# Patient Record
Sex: Female | Born: 1970 | Race: White | Hispanic: No | Marital: Married | Smoking: Former smoker
Health system: Southern US, Community
[De-identification: ages and names within clinical notes are randomized; demographics above are authoritative.]

## PROBLEM LIST (undated history)

## (undated) DIAGNOSIS — Z9889 Other specified postprocedural states: Secondary | ICD-10-CM

## (undated) DIAGNOSIS — R112 Nausea with vomiting, unspecified: Secondary | ICD-10-CM

## (undated) DIAGNOSIS — I10 Essential (primary) hypertension: Secondary | ICD-10-CM

## (undated) DIAGNOSIS — T8859XA Other complications of anesthesia, initial encounter: Secondary | ICD-10-CM

## (undated) DIAGNOSIS — T4145XA Adverse effect of unspecified anesthetic, initial encounter: Secondary | ICD-10-CM

## (undated) HISTORY — PX: CHOLECYSTECTOMY: SHX55

## (undated) HISTORY — PX: BREAST LUMPECTOMY: SHX2

## (undated) HISTORY — PX: BREAST SURGERY: SHX581

## (undated) HISTORY — PX: ABDOMINAL HYSTERECTOMY: SHX81

---

## 1998-01-17 ENCOUNTER — Ambulatory Visit (HOSPITAL_COMMUNITY): Admission: RE | Admit: 1998-01-17 | Discharge: 1998-01-17 | Payer: Self-pay | Admitting: *Deleted

## 1999-05-10 ENCOUNTER — Other Ambulatory Visit: Admission: RE | Admit: 1999-05-10 | Discharge: 1999-05-10 | Payer: Self-pay | Admitting: *Deleted

## 2004-12-11 ENCOUNTER — Encounter: Admission: RE | Admit: 2004-12-11 | Discharge: 2004-12-11 | Payer: Self-pay | Admitting: Obstetrics and Gynecology

## 2005-02-14 ENCOUNTER — Inpatient Hospital Stay (HOSPITAL_COMMUNITY): Admission: AD | Admit: 2005-02-14 | Discharge: 2005-02-14 | Payer: Self-pay | Admitting: Obstetrics and Gynecology

## 2005-02-15 ENCOUNTER — Inpatient Hospital Stay (HOSPITAL_COMMUNITY): Admission: AD | Admit: 2005-02-15 | Discharge: 2005-02-18 | Payer: Self-pay | Admitting: Obstetrics and Gynecology

## 2005-06-28 ENCOUNTER — Ambulatory Visit: Payer: Self-pay | Admitting: Family Medicine

## 2007-10-01 ENCOUNTER — Ambulatory Visit: Payer: Self-pay | Admitting: General Surgery

## 2009-12-29 ENCOUNTER — Ambulatory Visit: Payer: Self-pay | Admitting: Unknown Physician Specialty

## 2012-08-06 ENCOUNTER — Emergency Department: Payer: Self-pay | Admitting: Emergency Medicine

## 2012-09-30 HISTORY — PX: ANKLE SURGERY: SHX546

## 2013-07-06 ENCOUNTER — Ambulatory Visit: Payer: Self-pay | Admitting: Otolaryngology

## 2013-09-30 HISTORY — PX: ANKLE SURGERY: SHX546

## 2014-01-10 ENCOUNTER — Ambulatory Visit: Payer: Self-pay | Admitting: Orthopedic Surgery

## 2014-02-02 ENCOUNTER — Ambulatory Visit: Payer: Self-pay | Admitting: Internal Medicine

## 2014-09-24 ENCOUNTER — Emergency Department: Payer: Self-pay | Admitting: Emergency Medicine

## 2014-09-24 LAB — CBC
HCT: 38.9 % (ref 35.0–47.0)
HGB: 13.2 g/dL (ref 12.0–16.0)
MCH: 30.1 pg (ref 26.0–34.0)
MCHC: 34 g/dL (ref 32.0–36.0)
MCV: 89 fL (ref 80–100)
PLATELETS: 225 10*3/uL (ref 150–440)
RBC: 4.39 10*6/uL (ref 3.80–5.20)
RDW: 12.9 % (ref 11.5–14.5)
WBC: 8.6 10*3/uL (ref 3.6–11.0)

## 2014-09-24 LAB — BASIC METABOLIC PANEL
ANION GAP: 10 (ref 7–16)
BUN: 11 mg/dL (ref 7–18)
CALCIUM: 8.5 mg/dL (ref 8.5–10.1)
CO2: 25 mmol/L (ref 21–32)
CREATININE: 1.08 mg/dL (ref 0.60–1.30)
Chloride: 105 mmol/L (ref 98–107)
EGFR (Non-African Amer.): 59 — ABNORMAL LOW
Glucose: 110 mg/dL — ABNORMAL HIGH (ref 65–99)
Osmolality: 279 (ref 275–301)
Potassium: 3.5 mmol/L (ref 3.5–5.1)
Sodium: 140 mmol/L (ref 136–145)

## 2014-09-24 LAB — TROPONIN I

## 2014-09-25 LAB — D-DIMER(ARMC): D-Dimer: 778 ng/ml

## 2014-09-30 LAB — CULTURE, BLOOD (SINGLE)

## 2014-10-26 ENCOUNTER — Ambulatory Visit: Payer: Self-pay | Admitting: Internal Medicine

## 2017-03-25 ENCOUNTER — Other Ambulatory Visit: Payer: Self-pay | Admitting: Podiatry

## 2017-03-25 DIAGNOSIS — M25572 Pain in left ankle and joints of left foot: Secondary | ICD-10-CM

## 2017-03-31 ENCOUNTER — Ambulatory Visit: Payer: 59

## 2017-08-26 ENCOUNTER — Other Ambulatory Visit: Payer: Self-pay | Admitting: Podiatry

## 2017-08-27 ENCOUNTER — Ambulatory Visit: Payer: 59 | Admitting: Anesthesiology

## 2017-08-27 ENCOUNTER — Ambulatory Visit
Admission: RE | Admit: 2017-08-27 | Discharge: 2017-08-27 | Disposition: A | Discharge status: Home / Self Care | Payer: 59 | Source: Ambulatory Visit | Attending: Podiatry | Admitting: Podiatry

## 2017-08-27 ENCOUNTER — Encounter
Admission: RE | Disposition: A | Discharge status: Home / Self Care | Payer: Self-pay | Source: Ambulatory Visit | Attending: Podiatry

## 2017-08-27 DIAGNOSIS — S86312A Strain of muscle(s) and tendon(s) of peroneal muscle group at lower leg level, left leg, initial encounter: Secondary | ICD-10-CM | POA: Insufficient documentation

## 2017-08-27 DIAGNOSIS — E785 Hyperlipidemia, unspecified: Secondary | ICD-10-CM | POA: Insufficient documentation

## 2017-08-27 DIAGNOSIS — Z87891 Personal history of nicotine dependence: Secondary | ICD-10-CM | POA: Diagnosis not present

## 2017-08-27 DIAGNOSIS — E119 Type 2 diabetes mellitus without complications: Secondary | ICD-10-CM | POA: Insufficient documentation

## 2017-08-27 DIAGNOSIS — I10 Essential (primary) hypertension: Secondary | ICD-10-CM | POA: Insufficient documentation

## 2017-08-27 DIAGNOSIS — M65872 Other synovitis and tenosynovitis, left ankle and foot: Secondary | ICD-10-CM | POA: Diagnosis not present

## 2017-08-27 DIAGNOSIS — F418 Other specified anxiety disorders: Secondary | ICD-10-CM | POA: Insufficient documentation

## 2017-08-27 DIAGNOSIS — Z79899 Other long term (current) drug therapy: Secondary | ICD-10-CM | POA: Insufficient documentation

## 2017-08-27 DIAGNOSIS — X58XXXA Exposure to other specified factors, initial encounter: Secondary | ICD-10-CM | POA: Diagnosis not present

## 2017-08-27 HISTORY — DX: Other specified postprocedural states: Z98.890

## 2017-08-27 HISTORY — PX: TENDON REPAIR: SHX5111

## 2017-08-27 HISTORY — DX: Essential (primary) hypertension: I10

## 2017-08-27 HISTORY — DX: Nausea with vomiting, unspecified: R11.2

## 2017-08-27 HISTORY — DX: Adverse effect of unspecified anesthetic, initial encounter: T41.45XA

## 2017-08-27 HISTORY — DX: Other complications of anesthesia, initial encounter: T88.59XA

## 2017-08-27 SURGERY — TENDON REPAIR
Anesthesia: Regional | Laterality: Left | Wound class: Clean

## 2017-08-27 MED ORDER — CEFAZOLIN SODIUM-DEXTROSE 2-4 GM/100ML-% IV SOLN
2.0000 g | INTRAVENOUS | Status: AC
  Administered 2017-08-27: 2 g via INTRAVENOUS

## 2017-08-27 MED ORDER — GLYCOPYRROLATE 0.2 MG/ML IJ SOLN
INTRAMUSCULAR | Status: DC | PRN
  Administered 2017-08-27: 0.1 mg via INTRAVENOUS

## 2017-08-27 MED ORDER — SCOPOLAMINE 1 MG/3DAYS TD PT72
1.0000 | MEDICATED_PATCH | TRANSDERMAL | Status: DC
  Administered 2017-08-27: 1.5 mg via TRANSDERMAL

## 2017-08-27 MED ORDER — LIDOCAINE HCL (CARDIAC) 20 MG/ML IV SOLN
INTRAVENOUS | Status: DC | PRN
  Administered 2017-08-27: 40 mg via INTRATRACHEAL

## 2017-08-27 MED ORDER — BUPIVACAINE HCL (PF) 0.25 % IJ SOLN
INTRAMUSCULAR | Status: DC | PRN
  Administered 2017-08-27: 10 mL

## 2017-08-27 MED ORDER — PROPOFOL 10 MG/ML IV BOLUS
INTRAVENOUS | Status: DC | PRN
  Administered 2017-08-27: 140 mg via INTRAVENOUS

## 2017-08-27 MED ORDER — DEXAMETHASONE SODIUM PHOSPHATE 4 MG/ML IJ SOLN
INTRAMUSCULAR | Status: DC | PRN
  Administered 2017-08-27: 4 mg via INTRAVENOUS

## 2017-08-27 MED ORDER — LACTATED RINGERS IV SOLN
10.0000 mL/h | INTRAVENOUS | Status: DC
  Administered 2017-08-27: 10 mL/h via INTRAVENOUS

## 2017-08-27 MED ORDER — FENTANYL CITRATE (PF) 100 MCG/2ML IJ SOLN
INTRAMUSCULAR | Status: DC | PRN
  Administered 2017-08-27: 100 ug via INTRAVENOUS

## 2017-08-27 MED ORDER — POVIDONE-IODINE 7.5 % EX SOLN
Freq: Once | CUTANEOUS | Status: AC
  Administered 2017-08-27: 11:00:00 via TOPICAL

## 2017-08-27 MED ORDER — ROPIVACAINE HCL 5 MG/ML IJ SOLN
INTRAMUSCULAR | Status: DC | PRN
  Administered 2017-08-27: 30 mL via EPIDURAL

## 2017-08-27 MED ORDER — OXYCODONE-ACETAMINOPHEN 5-325 MG PO TABS: 1.0000 | ORAL_TABLET | ORAL | 0 refills | Status: DC | PRN

## 2017-08-27 MED ORDER — MIDAZOLAM HCL 5 MG/5ML IJ SOLN
INTRAMUSCULAR | Status: DC | PRN
  Administered 2017-08-27: 2 mg via INTRAVENOUS

## 2017-08-27 MED ORDER — ONDANSETRON HCL 4 MG/2ML IJ SOLN
INTRAMUSCULAR | Status: DC | PRN
  Administered 2017-08-27: 4 mg via INTRAVENOUS

## 2017-08-27 SURGICAL SUPPLY — 43 items
APL SKNCLS STERI-STRIP NONHPOA (GAUZE/BANDAGES/DRESSINGS) ×1
BANDAGE ELASTIC 4 LF NS (GAUZE/BANDAGES/DRESSINGS) ×2 IMPLANT
BENZOIN TINCTURE PRP APPL 2/3 (GAUZE/BANDAGES/DRESSINGS) ×1 IMPLANT
BLADE SURG 15 STRL LF DISP TIS (BLADE) IMPLANT
BLADE SURG 15 STRL SS (BLADE) ×2
BNDG CMPR 75X41 PLY HI ABS (GAUZE/BANDAGES/DRESSINGS)
BNDG CMPR MED 5X4 ELC HKLP NS (GAUZE/BANDAGES/DRESSINGS) ×1
BNDG ESMARK 4X12 TAN STRL LF (GAUZE/BANDAGES/DRESSINGS) ×2 IMPLANT
BNDG GAUZE 4.5X4.1 6PLY STRL (MISCELLANEOUS) ×2 IMPLANT
BNDG STRETCH 4X75 STRL LF (GAUZE/BANDAGES/DRESSINGS) IMPLANT
CANISTER SUCT 1200ML W/VALVE (MISCELLANEOUS) ×2 IMPLANT
COVER LIGHT HANDLE UNIVERSAL (MISCELLANEOUS) ×4 IMPLANT
CUFF TOURN SGL QUICK 24 (TOURNIQUET CUFF) ×2
CUFF TRNQT CYL 24X4X40X1 (TOURNIQUET CUFF) IMPLANT
DURAPREP 26ML APPLICATOR (WOUND CARE) ×2 IMPLANT
GAUZE PETRO XEROFOAM 1X8 (MISCELLANEOUS) ×2 IMPLANT
GAUZE SPONGE 4X4 12PLY STRL (GAUZE/BANDAGES/DRESSINGS) ×2 IMPLANT
GLOVE BIO SURGEON STRL SZ7.5 (GLOVE) ×2 IMPLANT
GLOVE INDICATOR 8.0 STRL GRN (GLOVE) ×2 IMPLANT
GOWN STRL REUS W/ TWL LRG LVL3 (GOWN DISPOSABLE) ×1 IMPLANT
GOWN STRL REUS W/ TWL XL LVL3 (GOWN DISPOSABLE) ×1 IMPLANT
GOWN STRL REUS W/TWL LRG LVL3 (GOWN DISPOSABLE) ×2
GOWN STRL REUS W/TWL XL LVL3 (GOWN DISPOSABLE) ×2
IV NS 500ML (IV SOLUTION) ×2
IV NS 500ML BAXH (IV SOLUTION) IMPLANT
KIT ROOM TURNOVER OR (KITS) ×2 IMPLANT
NS IRRIG 500ML POUR BTL (IV SOLUTION) ×2 IMPLANT
PACK EXTREMITY ARMC (MISCELLANEOUS) ×2 IMPLANT
PAD GROUND ADULT SPLIT (MISCELLANEOUS) ×2 IMPLANT
PADDING CAST BLEND 4X4 NS (MISCELLANEOUS) ×2 IMPLANT
STOCKINETTE STRL 6IN 960660 (GAUZE/BANDAGES/DRESSINGS) ×2 IMPLANT
STRAP BODY AND KNEE 60X3 (MISCELLANEOUS) ×4 IMPLANT
STRIP CLOSURE SKIN 1/4X4 (GAUZE/BANDAGES/DRESSINGS) ×2 IMPLANT
SUT MNCRL+ 5-0 UNDYED PC-3 (SUTURE) IMPLANT
SUT MONOCRYL 5-0 (SUTURE) ×1
SUT PDS 2-0 27IN (SUTURE) ×1 IMPLANT
SUT VIC AB 2-0 SH 27 (SUTURE) ×2
SUT VIC AB 2-0 SH 27XBRD (SUTURE) IMPLANT
SUT VIC AB 3-0 SH 27 (SUTURE) ×2
SUT VIC AB 3-0 SH 27X BRD (SUTURE) IMPLANT
SUT VIC AB 4-0 SH 27 (SUTURE) ×2
SUT VIC AB 4-0 SH 27XANBCTRL (SUTURE) IMPLANT
WAND TOPAZ MICRO DEBRIDER (MISCELLANEOUS) ×1 IMPLANT

## 2017-08-27 NOTE — H&P (Signed)
HISTORY AND PHYSICAL INTERVAL NOTE:  08/27/2017  12:14 PM  Penny Oliver  has presented today for surgery, with the diagnosis of Peroneal tendon tear.  The various methods of treatment have been discussed with the patient.  No guarantees were given.  After consideration of risks, benefits and other options for treatment, the patient has consented to surgery.  I have reviewed the patients' chart and labs.    Patient Vitals for the past 24 hrs:  BP Temp Temp src Pulse Resp SpO2 Height Weight  08/27/17 1155 - - - 62 16 99 % - -  08/27/17 1150 - - - 75 (!) 22 100 % - -  08/27/17 1145 107/80 - - 62 16 99 % - -  08/27/17 1140 - - - 60 (!) 22 99 % - -  08/27/17 1135 - - - 65 19 99 % - -  08/27/17 1130 103/82 - - 65 14 98 % - -  08/27/17 1125 - - - 68 (!) 23 99 % - -  08/27/17 1120 - - - 69 17 98 % - -  08/27/17 1115 112/76 - - 72 20 98 % - -  08/27/17 1110 107/74 - - 78 14 96 % - -  08/27/17 1109 107/74 - - 73 13 97 % - -  08/27/17 1105 130/82 - - 72 17 100 % - -  08/27/17 1042 126/86 98.1 F (36.7 C) Temporal 80 16 98 % 5\' 6"  (1.676 m) 83.9 kg (185 lb)    A history and physical examination was performed in my office.  The patient was reexamined.  There have been no changes to this history and physical examination.  [name], Davin Archuletta A

## 2017-08-27 NOTE — Brief Op Note (Signed)
08/27/2017  2:38 PM  PATIENT:  Penny Oliver  46 y.o. female  PRE-OPERATIVE DIAGNOSIS:  Peroneal tendon tear  POST-OPERATIVE DIAGNOSIS:  Peroneal tendon tear  PROCEDURE:  Procedure(s) with comments: TENDON REPAIR-Peroneal tendon (Left) - general w/ popliteal  SURGEON:  Surgeon(s) and Role:    Gwyneth Revels* Burtis Imhoff, DPM - Primary  PHYSICIAN ASSISTANT:   ASSISTANTS: none   ANESTHESIA:   local, regional and general  EBL:  5 mL   BLOOD ADMINISTERED:none  DRAINS: none   LOCAL MEDICATIONS USED:  BUPIVICAINE   SPECIMEN:  Source of Specimen:  left peroneal tendon  DISPOSITION OF SPECIMEN:  PATHOLOGY  COUNTS:  YES  TOURNIQUET:  * Missing tourniquet times found for documented tourniquets in log: [id number] *  250mmhg x ~70 minutes  DICTATION: .Dragon Dictation  PLAN OF CARE: Discharge to home after PACU  PATIENT DISPOSITION:  PACU - hemodynamically stable.   Delay start of Pharmacological VTE agent (>24hrs) due to surgical blood loss or risk of bleeding: n/a

## 2017-08-27 NOTE — Anesthesia Preprocedure Evaluation (Signed)
Anesthesia Evaluation  Patient identified by MRN, date of birth, ID band Patient awake    Reviewed: Allergy & Precautions, H&P , NPO status , Patient's Chart, lab work & pertinent test results, reviewed documented beta blocker date and time   History of Anesthesia Complications (+) PONV and history of anesthetic complications  Airway Mallampati: II  TM Distance: >3 FB Neck ROM: full    Dental no notable dental hx.    Pulmonary neg pulmonary ROS, former smoker,    Pulmonary exam normal breath sounds clear to auscultation       Cardiovascular Exercise Tolerance: Good hypertension, negative cardio ROS   Rhythm:regular Rate:Normal     Neuro/Psych negative neurological ROS  negative psych ROS   GI/Hepatic negative GI ROS, Neg liver ROS,   Endo/Other  negative endocrine ROS  Renal/GU negative Renal ROS  negative genitourinary   Musculoskeletal   Abdominal   Peds  Hematology negative hematology ROS (+)   Anesthesia Other Findings   Reproductive/Obstetrics negative OB ROS                             Anesthesia Physical Anesthesia Plan  ASA: II  Anesthesia Plan: General and Regional   Post-op Pain Management: GA combined w/ Regional for post-op pain   Induction:   PONV Risk Score and Plan:   Airway Management Planned:   Additional Equipment:   Intra-op Plan:   Post-operative Plan:   Informed Consent: I have reviewed the patients History and Physical, chart, labs and discussed the procedure including the risks, benefits and alternatives for the proposed anesthesia with the patient or authorized representative who has indicated his/her understanding and acceptance.   Dental Advisory Given  Plan Discussed with: CRNA  Anesthesia Plan Comments:         Anesthesia Quick Evaluation

## 2017-08-27 NOTE — Progress Notes (Signed)
Assisted Dr. Mamie NickSouras with left, ultrasound guided, popliteal block. Side rails up, monitors on throughout procedure. See vital signs in flow sheet. Tolerated Procedure well.

## 2017-08-27 NOTE — Anesthesia Postprocedure Evaluation (Signed)
Anesthesia Post Note  Patient: Despina AriasJulie K Zook  Procedure(s) Performed: TENDON REPAIR-Peroneal tendon (Left )  Patient location during evaluation: PACU Anesthesia Type: Regional Level of consciousness: awake and alert Pain management: pain level controlled Vital Signs Assessment: post-procedure vital signs reviewed and stable Respiratory status: spontaneous breathing, nonlabored ventilation, respiratory function stable and patient connected to nasal cannula oxygen Cardiovascular status: blood pressure returned to baseline and stable Postop Assessment: no apparent nausea or vomiting Anesthetic complications: no    SCOURAS, NICOLE ELAINE

## 2017-08-27 NOTE — Discharge Instructions (Signed)
[facility] °[facility] ° °POST OPERATIVE INSTRUCTIONS FOR DR. TROXLER AND DR. Correy Weidner °[facility] ° ° °1. Take your medication as prescribed.  Pain medication should be taken only as needed. ° °2. Keep the dressing clean, dry and intact. ° °3. Keep your foot elevated above the heart level for the first 48 hours. ° °4. Walking to the bathroom and brief periods of walking are acceptable, unless we have instructed you to be non-weight bearing. ° °5. Always use your crutches.  You are to be non-weight bearing. ° °6. Do not take a shower. Baths are permissible as long as the foot is kept out of the water.  ° °7. Every hour you are awake:  °- Bend your knee 15 times. ° ° °8. Call [facility] ([phone]) if any of the following problems occur: °- You develop a temperature or fever. °- The bandage becomes saturated with blood. °- Medication does not stop your pain. °- Injury of the foot occurs. °- Any symptoms of infection including redness, odor, or red streaks running from wound. ° ° °General Anesthesia, Adult, Care After °These instructions provide you with information about caring for yourself after your procedure. Your health care provider may also give you more specific instructions. Your treatment has been planned according to current medical practices, but problems sometimes occur. Call your health care provider if you have any problems or questions after your procedure. °What can I expect after the procedure? °After the procedure, it is common to have: °· Vomiting. °· A sore throat. °· Mental slowness. ° °It is common to feel: °· Nauseous. °· Cold or shivery. °· Sleepy. °· Tired. °· Sore or achy, even in parts of your body where you did not have surgery. ° °Follow these instructions at home: °For at least 24 hours after the procedure: °· Do not: °? Participate in activities where you could fall or become injured. °? Drive. °? Use heavy  machinery. °? Drink alcohol. °? Take sleeping pills or medicines that cause drowsiness. °? Make important decisions or sign legal documents. °? Take care of children on your own. °· Rest. °Eating and drinking °· If you vomit, drink water, juice, or soup when you can drink without vomiting. °· Drink enough fluid to keep your urine clear or pale yellow. °· Make sure you have little or no nausea before eating solid foods. °· Follow the diet recommended by your health care provider. °General instructions °· Have a responsible adult stay with you until you are awake and alert. °· Return to your normal activities as told by your health care provider. Ask your health care provider what activities are safe for you. °· Take over-the-counter and prescription medicines only as told by your health care provider. °· If you smoke, do not smoke without supervision. °· Keep all follow-up visits as told by your health care provider. This is important. °Contact a health care provider if: °· You continue to have nausea or vomiting at home, and medicines are not helpful. °· You cannot drink fluids or start eating again. °· You cannot urinate after 8-12 hours. °· You develop a skin rash. °· You have fever. °· You have increasing redness at the site of your procedure. °Get help right away if: °· You have difficulty breathing. °· You have chest pain. °· You have unexpected bleeding. °· You feel that you are having a life-threatening or urgent problem. °This information is not intended to replace advice given to   you by your health care provider. Make sure you discuss any questions you have with your health care provider. Document Released: 12/23/2000 Document Revised: 02/19/2016 Document Reviewed: 08/31/2015 Elsevier Interactive Patient Education  [employer].

## 2017-08-27 NOTE — Anesthesia Procedure Notes (Signed)
Procedure Name: LMA Insertion Date/Time: 08/27/2017 1:02 PM Performed by: Jimmy PicketAmyot, Bradyn Vassey, CRNA Pre-anesthesia Checklist: Patient identified, Emergency Drugs available, Suction available, Timeout performed and Patient being monitored Patient Re-evaluated:Patient Re-evaluated prior to induction Oxygen Delivery Method: Circle system utilized Preoxygenation: Pre-oxygenation with 100% oxygen Induction Type: IV induction LMA: LMA inserted LMA Size: 4.0 Number of attempts: 1 Placement Confirmation: positive ETCO2 and breath sounds checked- equal and bilateral Tube secured with: Tape

## 2017-08-27 NOTE — Anesthesia Procedure Notes (Signed)
Anesthesia Regional Block: Popliteal block   Pre-Anesthetic Checklist: ,, timeout performed, Correct Patient, Correct Site, Correct Laterality, Correct Procedure, Correct Position, site marked, Risks and benefits discussed,  Surgical consent,  Pre-op evaluation,  At surgeon's request and post-op pain management  Laterality: Left and Lower  Prep: chloraprep       Needles:  Injection technique: Single-shot  Needle Type: Stimiplex     Needle Length: 2cm  Needle Gauge: 22     Additional Needles:   Procedures:,,,, ultrasound used (permanent image in chart),,,,  Narrative:  Start time: 08/27/2017 11:00 AM End time: 08/27/2017 12:02 PM  Performed by: Personally  Anesthesiologist: Aldine ContesScouras, Juli Odom Elaine, MD

## 2017-08-27 NOTE — Transfer of Care (Signed)
Immediate Anesthesia Transfer of Care Note  Patient: Penny Oliver  Procedure(s) Performed: TENDON REPAIR-Peroneal tendon (Left )  Patient Location: PACU  Anesthesia Type: General, Regional  Level of Consciousness: awake, alert  and patient cooperative  Airway and Oxygen Therapy: Patient Spontanous Breathing and Patient connected to supplemental oxygen  Post-op Assessment: Post-op Vital signs reviewed, Patient's Cardiovascular Status Stable, Respiratory Function Stable, Patent Airway and No signs of Nausea or vomiting  Post-op Vital Signs: Reviewed and stable  Complications: No apparent anesthesia complications

## 2017-08-28 NOTE — Op Note (Signed)
Operative note   Surgeon:Kassandra Meriweather Armed forces logistics/support/administrative officerowler    Assistant: None    Preop diagnosis: 1.  Left peroneal brevis tendon tear 2.  Left peroneal longus tenosynovitis    Postop diagnosis: 1.  Left peroneal brevis split longitudinal tear 2.  Intratendinous tear left peroneal longus tendon    Procedure: 1.  Repair left peroneal brevis tear 2.  Repair left peroneal longus tear    EBL: Minimal    Anesthesia:regional and general    Hemostasis: Thigh tourniquet inflated to 250 mmHg for approximately 70 minutes    Specimen: Split peroneal brevis and longus tendon tear    Complications: None    Operative indications:Penny Oliver is an 46 y.o. that presents today for surgical intervention.  The risks/benefits/alternatives/complications have been discussed and consent has been given.    Procedure:  Patient was brought into the OR and placed on the operating table in thesupine position. After anesthesia was obtained theleft lower extremity was prepped and draped in usual sterile fashion.  Attention was directed to the lateral aspect of the ankle where a longitudinal incision was made along the peroneal tendon beginning approximately 3 cm proximal to the fibula and ending approximately 3 cm distal to the fibula.  Sharp and blunt dissection was carried down to the peroneal tendon sheath.  The tendon sheath was then entered.  At this time there was noted to be tenosynovitis which was removed.  A low-lying peroneal brevis muscle belly was noted and partially resected.  Inspection of the tendons revealed the peroneal longus tendon had a an intra-tendinous bulbous area consistent with intratendinous tear.  The peroneal brevis tendon had a small split thickness tendon tear longitudinally.  The peroneal brevis tendon was debrided and a portion of the tendon resected.  At this time the wound was flushed with copious amounts of irrigation.  The peroneal brevis tendon was then tubularized with a 3-0 Vicryl suture.  The  peroneal longus tendon was then evaluated and at the bulbous area and this was then resected intratendinous leak.  Removal of the thickened fibrotic tissue was performed.  Tubularization with 3-0 Vicryl suture was then performed.  Both tendons were then infiltrated with the Topaz wand proximal and distal.  Final irrigation was then performed.  Closure of the peroneal sheath was performed proximal and distal to the fibula with 3-0 Vicryl.  The peroneal retinaculum was reapproximated with a pants over vest suture with a 2-0 PDS.  Continue closure was then performed with a combination of 3-0 and 4-0 Vicryl and the skin was closed with a with 5-0 Monocryl.  The wound was then infiltrated with 0.25% Marcaine.  A popliteal block had been applied to the leg preoperatively.  Patient was placed in a sterile bulky dressing and placed in an equalizer walker boot with the foot in 90 degree neutral position.    Patient tolerated the procedure and anesthesia well.  Was transported from the OR to the PACU with all vital signs stable and vascular status intact. To be discharged per routine protocol.  Will follow up in approximately 1 week in the outpatient clinic.

## 2017-08-29 ENCOUNTER — Encounter: Payer: Self-pay | Admitting: Podiatry

## 2017-08-29 LAB — SURGICAL PATHOLOGY

## 2017-11-18 ENCOUNTER — Ambulatory Visit
Admission: RE | Admit: 2017-11-18 | Discharge: 2017-11-18 | Disposition: A | Discharge status: Home / Self Care | Payer: 59 | Source: Ambulatory Visit | Attending: Family Medicine | Admitting: Family Medicine

## 2017-11-18 ENCOUNTER — Other Ambulatory Visit: Payer: Self-pay | Admitting: Family Medicine

## 2017-11-18 ENCOUNTER — Other Ambulatory Visit
Admission: RE | Admit: 2017-11-18 | Discharge: 2017-11-18 | Disposition: A | Discharge status: Home / Self Care | Payer: 59 | Source: Ambulatory Visit | Attending: Family Medicine | Admitting: Family Medicine

## 2017-11-18 DIAGNOSIS — R7989 Other specified abnormal findings of blood chemistry: Secondary | ICD-10-CM

## 2017-11-18 DIAGNOSIS — R079 Chest pain, unspecified: Secondary | ICD-10-CM | POA: Diagnosis present

## 2017-11-18 DIAGNOSIS — Z0189 Encounter for other specified special examinations: Secondary | ICD-10-CM | POA: Diagnosis present

## 2017-11-18 DIAGNOSIS — R918 Other nonspecific abnormal finding of lung field: Secondary | ICD-10-CM | POA: Diagnosis not present

## 2017-11-18 LAB — TROPONIN I

## 2017-11-18 LAB — FIBRIN DERIVATIVES D-DIMER (ARMC ONLY): FIBRIN DERIVATIVES D-DIMER (ARMC): 909.06 ng{FEU}/mL — AB (ref 0.00–499.00)

## 2017-11-18 MED ORDER — IOPAMIDOL (ISOVUE-370) INJECTION 76%
75.0000 mL | Freq: Once | INTRAVENOUS | Status: AC | PRN
  Administered 2017-11-18: 75 mL via INTRAVENOUS

## 2017-11-24 ENCOUNTER — Encounter: Payer: Self-pay | Admitting: Pulmonary Disease

## 2017-11-24 ENCOUNTER — Ambulatory Visit (INDEPENDENT_AMBULATORY_CARE_PROVIDER_SITE_OTHER): Payer: 59 | Admitting: Pulmonary Disease

## 2017-11-24 ENCOUNTER — Other Ambulatory Visit
Admission: RE | Admit: 2017-11-24 | Discharge: 2017-11-24 | Disposition: A | Discharge status: Home / Self Care | Payer: 59 | Source: Ambulatory Visit | Attending: Pulmonary Disease | Admitting: Pulmonary Disease

## 2017-11-24 VITALS — BP 148/88 | HR 87 | Ht 66.0 in | Wt 189.0 lb

## 2017-11-24 DIAGNOSIS — R000 Tachycardia, unspecified: Secondary | ICD-10-CM

## 2017-11-24 DIAGNOSIS — J189 Pneumonia, unspecified organism: Secondary | ICD-10-CM | POA: Diagnosis not present

## 2017-11-24 DIAGNOSIS — R002 Palpitations: Secondary | ICD-10-CM

## 2017-11-24 DIAGNOSIS — Z853 Personal history of malignant neoplasm of breast: Secondary | ICD-10-CM | POA: Diagnosis not present

## 2017-11-24 LAB — CBC WITH DIFFERENTIAL/PLATELET
BASOS PCT: 3 %
Basophils Absolute: 0.1 10*3/uL (ref 0–0.1)
Eosinophils Absolute: 0.2 10*3/uL (ref 0–0.7)
Eosinophils Relative: 4 %
HEMATOCRIT: 39.7 % (ref 35.0–47.0)
HEMOGLOBIN: 13.3 g/dL (ref 12.0–16.0)
LYMPHS ABS: 1.6 10*3/uL (ref 1.0–3.6)
LYMPHS PCT: 32 %
MCH: 29.5 pg (ref 26.0–34.0)
MCHC: 33.5 g/dL (ref 32.0–36.0)
MCV: 88.2 fL (ref 80.0–100.0)
MONO ABS: 0.4 10*3/uL (ref 0.2–0.9)
MONOS PCT: 9 %
NEUTROS ABS: 2.6 10*3/uL (ref 1.4–6.5)
Neutrophils Relative %: 52 %
Platelets: 281 10*3/uL (ref 150–440)
RBC: 4.5 MIL/uL (ref 3.80–5.20)
RDW: 13.1 % (ref 11.5–14.5)
WBC: 5 10*3/uL (ref 3.6–11.0)

## 2017-11-24 NOTE — Patient Instructions (Signed)
And evaluation of pneumonitis (inflammation in the lungs) several blood tests have been ordered: ESR, ANA, RF, BNP. I have also ordered full lung function tests (PFTs). Follow-up in 3-4 weeks to review the results of the above tests.  If any important results come back in the interim, we will contact you to discuss

## 2017-11-24 NOTE — Progress Notes (Signed)
PULMONARY CONSULT NOTE  Requesting MD/Service: Kerrin Mo, MD Date of initial consultation: 11/24/17 Reason for consultation: Pneumonitis  PT PROFILE: 47 y.o. female former smoker (quit 1998) patient of Dr. Caryl Comes, seen by Dr. Netty Starring 11/18/17 with complaints of episodic tachycardia, chest pain.  Underwent CTA chest revealing no pulmonary embolism but mild patchy groundglass opacities.  Referred for further evaluation.  DATA: 02/04/14 echocardiogram: Normal 09/25/14 CTA chest (report only): No evidence of pulmonary embolus. Patchy right lower lobe pneumonia noted, likely explaining the patient's symptoms 10/26/14 LDCT chest: No acute infiltrate or pulmonary edema. Previous right lower lobe infiltrate has resolved 11/18/17 CTA chest: No pulmonary embolism.  Mild diffuse groundglass opacities consistent with pneumonitis  HPI:  As above.  Her current history dates back to November 2018 when she underwent surgery for repair of left peroneal tendon.  Since that time she has been "sick" with various problems including sinus symptoms, gastroenteritis, sore throat, nasal congestion.  The symptoms have been off and on.  More recently, she has developed episodic tachycardia and palpitations.  She saw her primary care physician with this complaint.  She also noted chest tightness which was non-exertional.  In evaluation, a CT angio of the chest was ordered.  There was no evidence of pulmonary embolism but it did reveal mild diffuse groundglass opacities in a mosaic pattern.  Therefore she is referred for further evaluation.  Other evaluation has included a 24-hour Holter monitor which, per her report, was "okay".  Her most recent episode of tachycardia was this morning.  Again, these episodes are nonexertional.  She exercises on a regular basis, presently doing yoga.  She denies any respiratory complaints and denies chest pain while exercising.  She does have occasional nonproductive cough.  She denies fever  and pleuritic chest pain.  She has had no hemoptysis.  She denies orthopnea, paroxysmal nocturnal dyspnea, lower extremity edema and calf tenderness.  She reports that 3 years ago, she and her husband had to move out of their home due to "black mold".  She was treated for this with alternative therapies by a physician in [place].  She has fully recovered from her symptoms at that time.  She also notes pneumonia on 2 occasions in the past 5 years.  An echocardiogram was performed in 2015.  This was performed for symptoms similar to her current symptoms.  That echocardiogram was entirely normal.  Past Medical History:  Diagnosis Date  . Complication of anesthesia    PONV  . Hypertension   . PONV (postoperative nausea and vomiting)   Breast cancer  Past Surgical History:  Procedure Laterality Date  . ABDOMINAL HYSTERECTOMY    . ANKLE SURGERY Left 2015  . ANKLE SURGERY Right 2014  . BREAST LUMPECTOMY Left   . BREAST SURGERY Left    Breast Reconstruction  . CHOLECYSTECTOMY    . TENDON REPAIR Left 08/27/2017   Procedure: TENDON REPAIR-Peroneal tendon;  Surgeon: Samara Deist, DPM;  Location: [place];  Service: Podiatry;  Laterality: Left;  general w/ popliteal    MEDICATIONS: I have reviewed all medications and confirmed regimen as documented  Social History   Socioeconomic History  . Marital status: Married    Spouse name: Not on file  . Number of children: Not on file  . Years of education: Not on file  . Highest education level: Not on file  Social Needs  . Financial resource strain: Not on file  . Food insecurity - worry: Not on file  . Food  insecurity - inability: Not on file  . Transportation needs - medical: Not on file  . Transportation needs - non-medical: Not on file  Occupational History  . Not on file  Tobacco Use  . Smoking status: Former Smoker    Last attempt to quit: 08/18/1997    Years since quitting: 20.2  . Smokeless tobacco: Never Used   Substance and Sexual Activity  . Alcohol use: Not on file  . Drug use: Not on file  . Sexual activity: Not on file  Other Topics Concern  . Not on file  Social History Narrative  . Not on file    History reviewed. No pertinent family history.  ROS: No fever, myalgias/arthralgias, unexplained weight loss or weight gain No new focal weakness or sensory deficits No otalgia, hearing loss, visual changes, nasal and sinus symptoms, mouth and throat problems No neck pain or adenopathy No abdominal pain, N/V/D, diarrhea, change in bowel pattern No dysuria, change in urinary pattern   Vitals:   11/24/17 1019 11/24/17 1023  BP:  (!) 148/88  Pulse:  87  SpO2:  98%  Weight: 85.7 kg (189 lb)   Height: '5\' 6"'  (1.676 m)      EXAM:  Gen: WDWN, No overt respiratory distress HEENT: NCAT, sclera white, oropharynx normal Neck: Supple without LAN, thyromegaly, JVD Lungs: Percussion is normal throughout, breath sounds are full with no wheezes or other adventitious sounds Cardiovascular: RRR, no murmurs noted Abdomen: Soft, nontender, normal BS Ext: without clubbing, cyanosis, edema Neuro: CNs grossly intact, motor and sensory intact Skin: Limited exam, no lesions noted  DATA:   BMP Latest Ref Rng & Units 09/24/2014  Glucose 65 - 99 mg/dL 110(H)  BUN 7 - 18 mg/dL 11  Creatinine 0.60 - 1.30 mg/dL 1.08  Sodium 136 - 145 mmol/L 140  Potassium 3.5 - 5.1 mmol/L 3.5  Chloride 98 - 107 mmol/L 105  CO2 21 - 32 mmol/L 25  Calcium 8.5 - 10.1 mg/dL 8.5    CBC Latest Ref Rng & Units 09/24/2014  WBC 3.6 - 11.0 x10 3/mm 3 8.6  Hemoglobin 12.0 - 16.0 g/dL 13.2  Hematocrit 35.0 - 47.0 % 38.9  Platelets 150 - 440 x10 3/mm 3 225    CXR:  None available  I have personally reviewed all chest radiographic images that are available to me.  IMPRESSION:     ICD-10-CM   1. Pneumonitis pattern on CT chest -this is essentially an incidental finding.  J18.9 Sedimentation rate, automated    ANA,  IFA Comprehensive Panel    Rheumatoid factor    Pulmonary Function Test [facility] Only    CBC with Differential/Platelet  2. Tachycardia R00.0 Brain natriuretic peptide  3. Palpitations R00.2 Brain natriuretic peptide  4. History of breast cancer Z85.3     History of breast cancer 20 yrs ago treated with lumpectomy, XRT, adriamycin and cyclophosphamide. Acute presentation of episodic tachycardia, palpitations. Recent repair of L Peroneal tendon. CTA chest with incidental finding of pneumonitis.  She is essentially asymptomatic from a pulmonary perspective   PLAN:  Reviewed her CT scan together and discussed the findings I have ordered the following blood tests: BNP, ESR, ANA, rheumatoid factor I have ordered full pulmonary function tests Follow-up in 3-4 weeks to discuss the results of the above tests   Merton Border, MD PCCM service Mobile [phone] Pager [phone] 11/24/2017 11:00 AM

## 2017-11-26 ENCOUNTER — Other Ambulatory Visit
Admission: RE | Admit: 2017-11-26 | Discharge: 2017-11-26 | Disposition: A | Discharge status: Home / Self Care | Payer: 59 | Source: Ambulatory Visit | Attending: Pulmonary Disease | Admitting: Pulmonary Disease

## 2017-11-26 DIAGNOSIS — R002 Palpitations: Secondary | ICD-10-CM | POA: Diagnosis not present

## 2017-11-26 DIAGNOSIS — R000 Tachycardia, unspecified: Secondary | ICD-10-CM | POA: Insufficient documentation

## 2017-11-26 DIAGNOSIS — R918 Other nonspecific abnormal finding of lung field: Secondary | ICD-10-CM | POA: Diagnosis not present

## 2017-11-26 LAB — BRAIN NATRIURETIC PEPTIDE: B Natriuretic Peptide: 7 pg/mL (ref 0.0–100.0)

## 2017-11-26 LAB — SEDIMENTATION RATE: SED RATE: 18 mm/h (ref 0–20)

## 2017-11-27 ENCOUNTER — Encounter: Payer: Self-pay | Admitting: *Deleted

## 2017-11-27 LAB — ANA COMPREHENSIVE PANEL
Centromere Ab Screen: <0.2 AI (ref 0.0–0.9)
ENA SM Ab Ser-aCnc: <0.2 AI (ref 0.0–0.9)
Ribonucleic Protein: <0.2 AI (ref 0.0–0.9)
ds DNA Ab: 1 IU/mL (ref 0–9)

## 2017-11-27 LAB — RHEUMATOID FACTOR: Rhuematoid fact SerPl-aCnc: 10.6 IU/mL (ref 0.0–13.9)

## 2017-12-25 ENCOUNTER — Ambulatory Visit: Payer: 59 | Admitting: Pulmonary Disease

## 2018-02-16 ENCOUNTER — Ambulatory Visit: Payer: 59 | Admitting: Pulmonary Disease

## 2018-02-18 ENCOUNTER — Other Ambulatory Visit
Admission: RE | Admit: 2018-02-18 | Discharge: 2018-02-18 | Disposition: A | Discharge status: Home / Self Care | Payer: 59 | Source: Ambulatory Visit | Attending: Internal Medicine | Admitting: Internal Medicine

## 2018-02-18 DIAGNOSIS — Z853 Personal history of malignant neoplasm of breast: Secondary | ICD-10-CM | POA: Diagnosis not present

## 2018-02-18 LAB — FIBRIN DERIVATIVES D-DIMER (ARMC ONLY): Fibrin derivatives D-dimer (ARMC): 429.12 ng/mL (FEU) (ref 0.00–499.00)

## 2018-08-04 ENCOUNTER — Other Ambulatory Visit: Payer: Self-pay | Admitting: *Deleted

## 2018-08-04 DIAGNOSIS — R918 Other nonspecific abnormal finding of lung field: Secondary | ICD-10-CM

## 2018-08-11 ENCOUNTER — Ambulatory Visit: Payer: 59

## 2018-08-18 ENCOUNTER — Ambulatory Visit: Payer: 59

## 2019-08-10 ENCOUNTER — Other Ambulatory Visit: Payer: Self-pay | Admitting: Internal Medicine

## 2019-08-10 DIAGNOSIS — I10 Essential (primary) hypertension: Secondary | ICD-10-CM

## 2019-08-19 ENCOUNTER — Ambulatory Visit
Admission: RE | Admit: 2019-08-19 | Discharge: 2019-08-19 | Disposition: A | Discharge status: Home / Self Care | Payer: BC Managed Care – PPO | Source: Ambulatory Visit | Attending: Internal Medicine | Admitting: Internal Medicine

## 2019-08-19 ENCOUNTER — Other Ambulatory Visit: Payer: Self-pay

## 2019-08-19 DIAGNOSIS — I10 Essential (primary) hypertension: Secondary | ICD-10-CM | POA: Diagnosis not present

## 2019-10-07 ENCOUNTER — Other Ambulatory Visit: Payer: BC Managed Care – PPO

## 2019-10-08 ENCOUNTER — Other Ambulatory Visit: Payer: BC Managed Care – PPO

## 2019-10-13 ENCOUNTER — Ambulatory Visit: Payer: BC Managed Care – PPO | Attending: Internal Medicine

## 2019-10-13 DIAGNOSIS — Z20822 Contact with and (suspected) exposure to covid-19: Secondary | ICD-10-CM

## 2019-10-14 LAB — NOVEL CORONAVIRUS, NAA: SARS-CoV-2, NAA: NOT DETECTED

## 2019-10-21 ENCOUNTER — Ambulatory Visit: Payer: BC Managed Care – PPO | Attending: Internal Medicine

## 2019-10-21 DIAGNOSIS — Z20822 Contact with and (suspected) exposure to covid-19: Secondary | ICD-10-CM

## 2019-10-22 LAB — NOVEL CORONAVIRUS, NAA: SARS-CoV-2, NAA: NOT DETECTED

## 2019-12-17 ENCOUNTER — Ambulatory Visit: Payer: BC Managed Care – PPO

## 2019-12-17 ENCOUNTER — Other Ambulatory Visit: Payer: Self-pay

## 2019-12-17 ENCOUNTER — Ambulatory Visit: Payer: BC Managed Care – PPO | Attending: Internal Medicine

## 2019-12-17 DIAGNOSIS — Z23 Encounter for immunization: Secondary | ICD-10-CM

## 2019-12-17 NOTE — Progress Notes (Signed)
   Covid-19 Vaccination Clinic  Name:  Penny Oliver    MRN: [id number] DOB: 06-13-1971  12/17/2019  [name] was observed post Covid-19 immunization for 30 minutes based on pre-vaccination screening without incident. She was provided with Vaccine Information Sheet and instruction to access the V-Safe system.   [name] was instructed to call 911 with any severe reactions post vaccine: Marland Kitchen Difficulty breathing  . Swelling of face and throat  . A fast heartbeat  . A bad rash all over body  . Dizziness and weakness   Immunizations Administered    Name Date Dose VIS Date Route   Pfizer COVID-19 Vaccine 12/17/2019  3:29 PM 0.3 mL 09/10/2019 Intramuscular   Manufacturer: [employer], [employer]   Lot: [id number]   NDC: [address]-7

## 2020-01-07 ENCOUNTER — Ambulatory Visit: Payer: BC Managed Care – PPO | Attending: Internal Medicine

## 2020-01-07 DIAGNOSIS — Z23 Encounter for immunization: Secondary | ICD-10-CM

## 2020-01-07 NOTE — Progress Notes (Signed)
   Covid-19 Vaccination Clinic  Name:  SOPHIAGRACE BENBROOK    MRN: [id number] DOB: 03-Dec-1970  01/07/2020  Ms. Farooqui was observed post Covid-19 immunization for 15 minutes without incident. She was provided with Vaccine Information Sheet and instruction to access the V-Safe system.   Ms. Coppock was instructed to call 911 with any severe reactions post vaccine: Marland Kitchen Difficulty breathing  . Swelling of face and throat  . A fast heartbeat  . A bad rash all over body  . Dizziness and weakness   Immunizations Administered    Name Date Dose VIS Date Route   Pfizer COVID-19 Vaccine 01/07/2020  3:23 PM 0.3 mL 09/10/2019 Intramuscular   Manufacturer: [employer], [employer]   Lot: [id number]   NDC: [address]-8

## 2020-10-06 ENCOUNTER — Other Ambulatory Visit: Payer: Self-pay

## 2020-10-06 ENCOUNTER — Other Ambulatory Visit: Payer: BC Managed Care – PPO

## 2020-10-06 DIAGNOSIS — Z20822 Contact with and (suspected) exposure to covid-19: Secondary | ICD-10-CM

## 2020-10-10 LAB — NOVEL CORONAVIRUS, NAA: SARS-CoV-2, NAA: NOT DETECTED

## 2020-10-14 ENCOUNTER — Other Ambulatory Visit: Payer: BC Managed Care – PPO

## 2020-10-14 DIAGNOSIS — Z20822 Contact with and (suspected) exposure to covid-19: Secondary | ICD-10-CM

## 2020-10-18 LAB — NOVEL CORONAVIRUS, NAA: SARS-CoV-2, NAA: NOT DETECTED

## 2021-10-18 IMAGING — CT CT CHEST W/O CM
1 series · 15 of 34 positions shown, 19 images · non-contrast
Comparison: Chest CT dated 11/18/2017.

CLINICAL DATA: 48-year-old female with history of right breast
cancer presenting for follow-up CT.

EXAM:
CT CHEST WITHOUT CONTRAST
TECHNIQUE: Multidetector CT imaging of the chest was performed following the
standard protocol without IV contrast.

[Series 3: thorax · axial · 0.71mm/px · z∈[-694,-426]mm · 15 of 158 slices shown, 19 images]
[im 12/158  mediastinal]
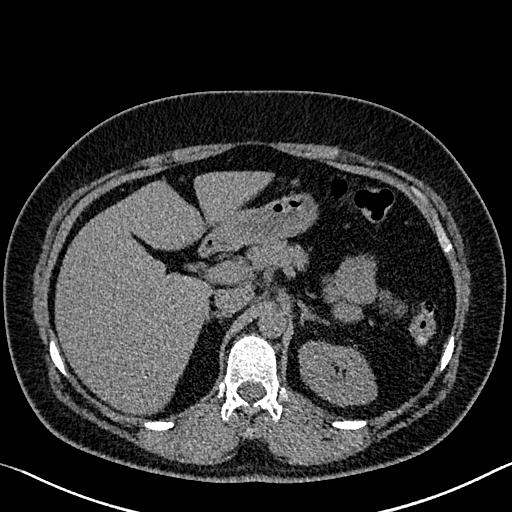
[im 12/158  lung]
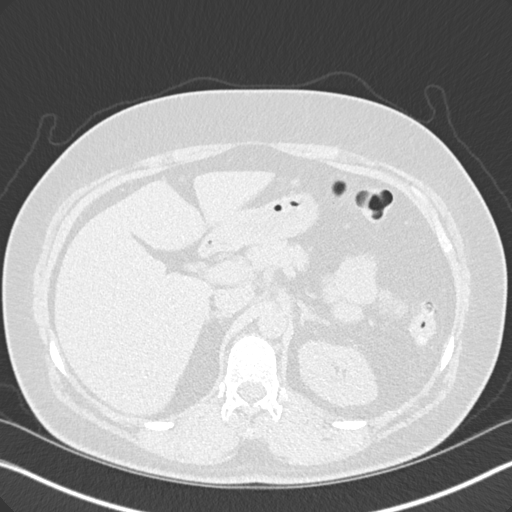
[im 24/158  lung]
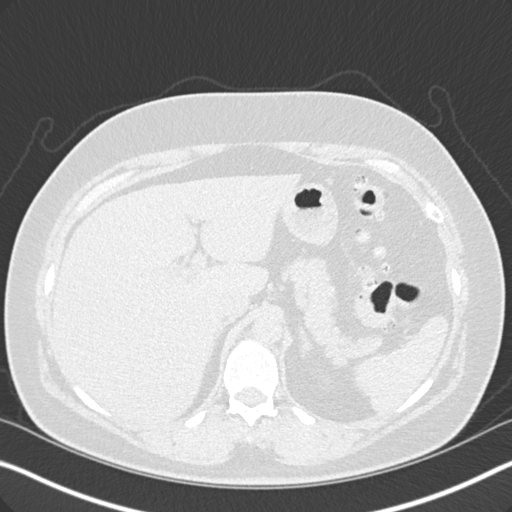
[im 32/158  lung]
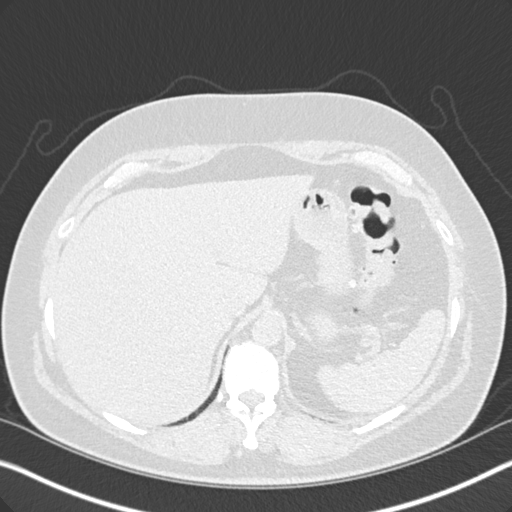
[im 41/158  lung]
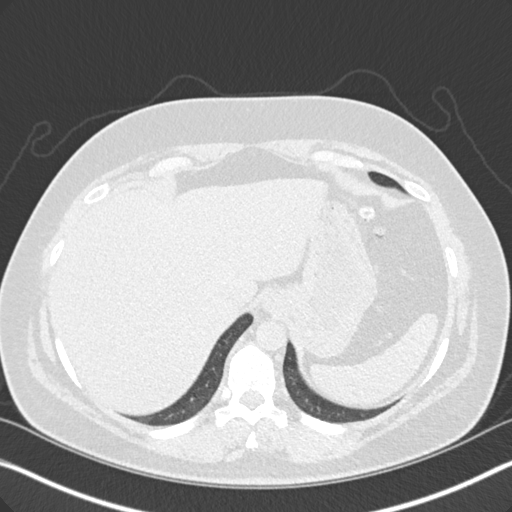
[im 53/158  mediastinal]
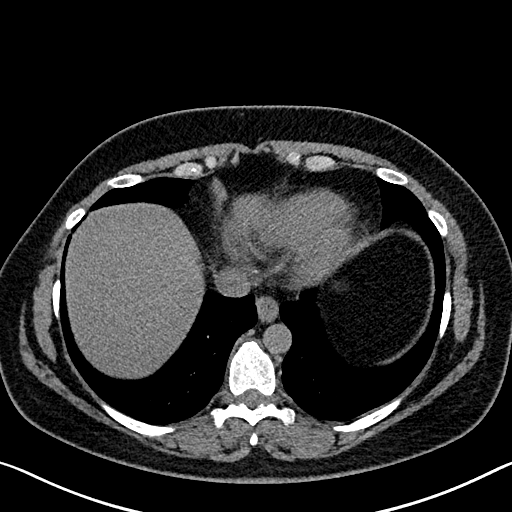
[im 53/158  lung]
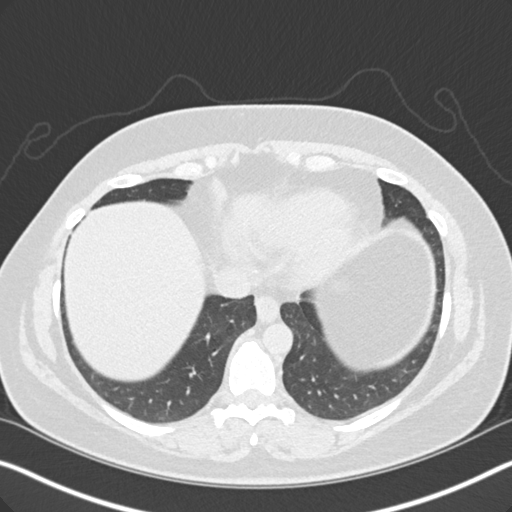
[im 63/158  lung]
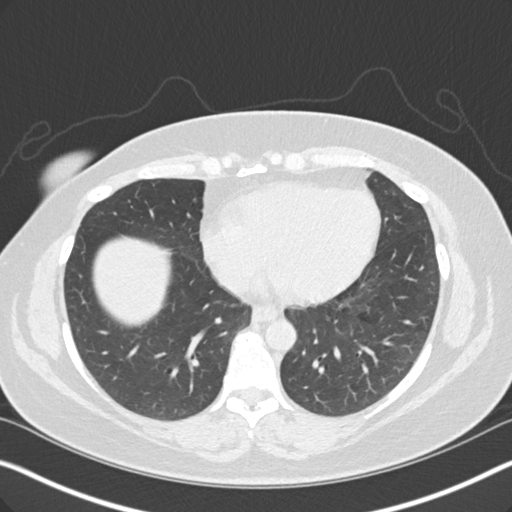
[im 70/158  lung]
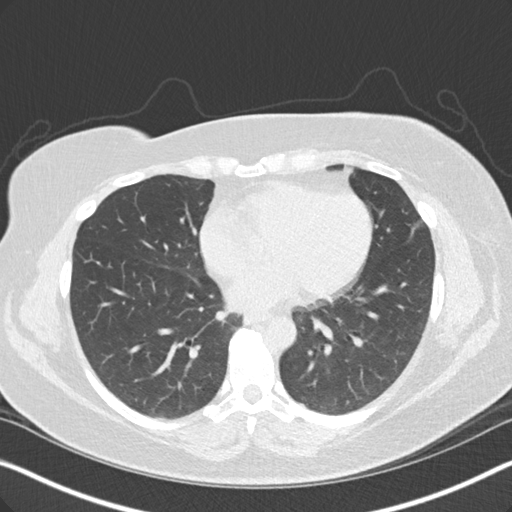
[im 82/158  lung]
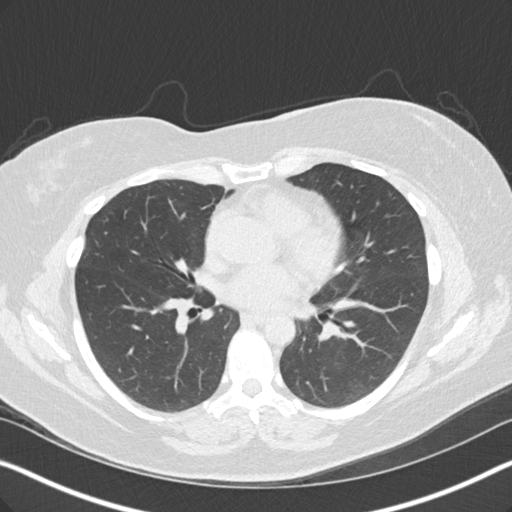
[im 88/158  mediastinal]
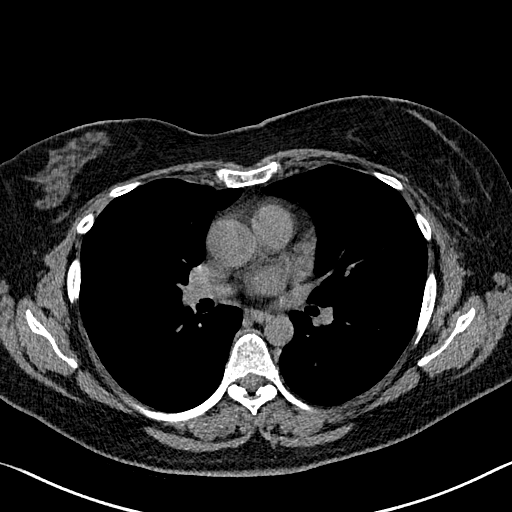
[im 88/158  lung]
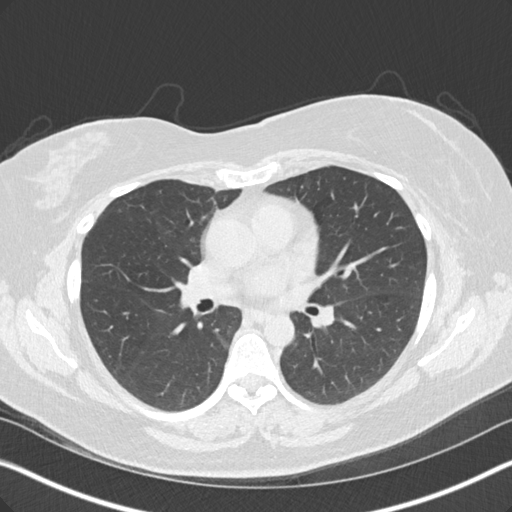
[im 95/158  lung]
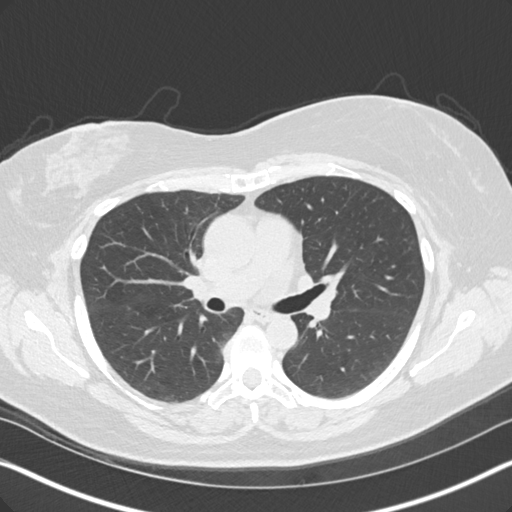
[im 105/158  lung]
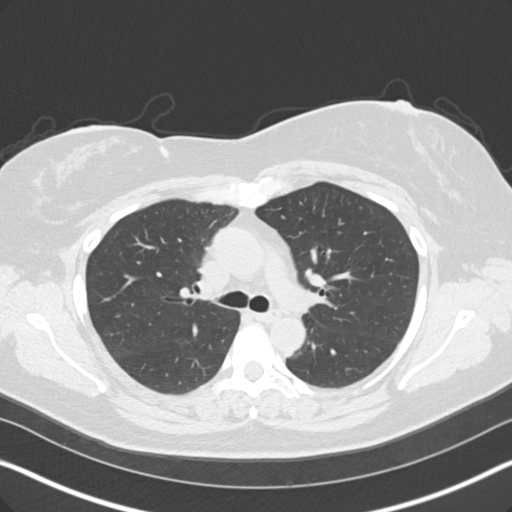
[im 117/158  lung]
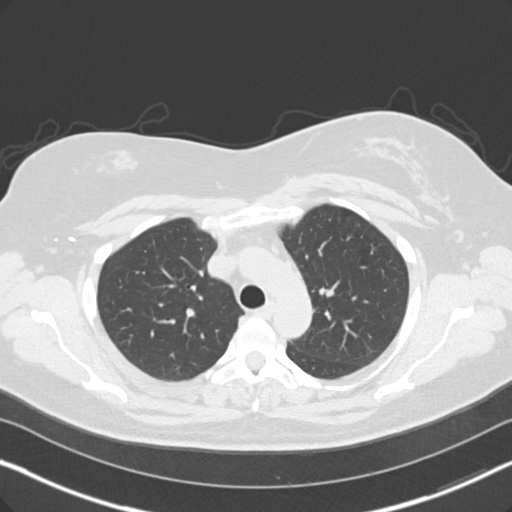
[im 126/158  mediastinal]
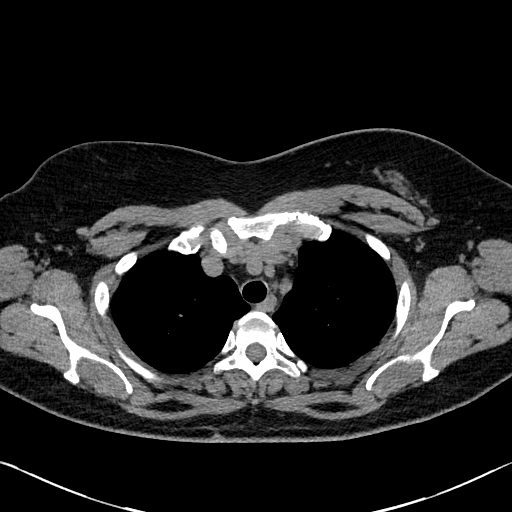
[im 126/158  lung]
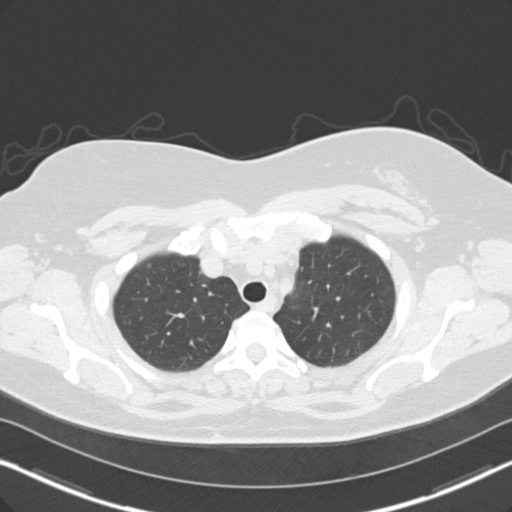
[im 134/158  lung]
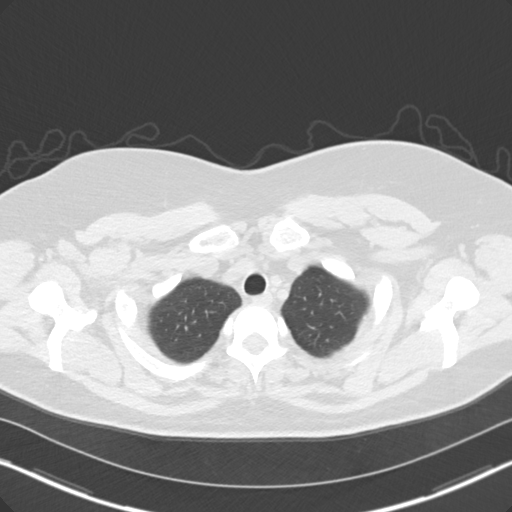
[im 146/158  lung]
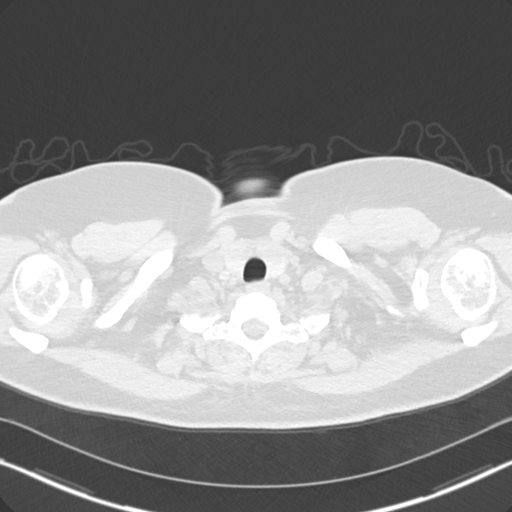

[15 of 34 positions shown; findings below may reference images not displayed]

FINDINGS: Evaluation of this exam is limited in the absence of intravenous
contrast.

Cardiovascular: There is no cardiomegaly or pericardial effusion.
The thoracic aorta and central pulmonary arteries are grossly
unremarkable on this noncontrast CT.

Mediastinum/Nodes: No hilar or mediastinal adenopathy. The esophagus
and the thyroid gland are grossly unremarkable. No mediastinal fluid
collection.

Lungs/Pleura: There is a 2 mm left upper lobe calcified granuloma
(series 4 image 61). The lungs are otherwise clear. There has been
interval clearance of the previously seen diffuse hazy airspace
density. There is no pleural effusion or pneumothorax. The central
airways are patent.

Upper Abdomen: Probable mild fatty liver. Cholecystectomy. Scattered
colonic diverticula.

Musculoskeletal: Stable sclerotic focus over the posterior left
ninth rib similar to prior CT, likely a bone island. No acute
osseous pathology. No suspicious lesions. Right axillary lymph node
dissection clips. Postsurgical changes of the right breast. A 7 mm
focus of calcification in the left breast.
IMPRESSION: No acute intrathoracic pathology. No evidence of metastatic disease
in the chest.
